# Patient Record
Sex: Male | Born: 1985 | Race: Black or African American | Hispanic: No | Marital: Single | Smoking: Never smoker
Health system: Southern US, Community
[De-identification: ages and names within clinical notes are randomized; demographics above are authoritative.]

---

## 2015-03-29 ENCOUNTER — Emergency Department (HOSPITAL_COMMUNITY)
Admission: EM | Admit: 2015-03-29 | Discharge: 2015-03-29 | Disposition: A | Discharge status: Home / Self Care | Payer: Managed Care, Other (non HMO) | Attending: Emergency Medicine | Admitting: Emergency Medicine

## 2015-03-29 ENCOUNTER — Encounter (HOSPITAL_COMMUNITY): Payer: Self-pay | Admitting: Emergency Medicine

## 2015-03-29 ENCOUNTER — Emergency Department (HOSPITAL_COMMUNITY): Payer: Managed Care, Other (non HMO)

## 2015-03-29 DIAGNOSIS — Y998 Other external cause status: Secondary | ICD-10-CM | POA: Insufficient documentation

## 2015-03-29 DIAGNOSIS — Y9289 Other specified places as the place of occurrence of the external cause: Secondary | ICD-10-CM | POA: Insufficient documentation

## 2015-03-29 DIAGNOSIS — S43401A Unspecified sprain of right shoulder joint, initial encounter: Secondary | ICD-10-CM | POA: Diagnosis not present

## 2015-03-29 DIAGNOSIS — W19XXXA Unspecified fall, initial encounter: Secondary | ICD-10-CM

## 2015-03-29 DIAGNOSIS — Y9302 Activity, running: Secondary | ICD-10-CM | POA: Diagnosis not present

## 2015-03-29 DIAGNOSIS — W01198A Fall on same level from slipping, tripping and stumbling with subsequent striking against other object, initial encounter: Secondary | ICD-10-CM | POA: Insufficient documentation

## 2015-03-29 DIAGNOSIS — S4991XA Unspecified injury of right shoulder and upper arm, initial encounter: Secondary | ICD-10-CM | POA: Diagnosis present

## 2015-03-29 MED ORDER — IBUPROFEN 800 MG PO TABS: 800.0000 mg | ORAL_TABLET | Freq: Three times a day (TID) | ORAL | Status: DC

## 2015-03-29 MED ORDER — IBUPROFEN 400 MG PO TABS
800.0000 mg | ORAL_TABLET | Freq: Once | ORAL | Status: AC
Start: 2015-03-29 — End: 2015-03-29
  Administered 2015-03-29: 800 mg via ORAL
  Filled 2015-03-29: qty 2

## 2015-03-29 NOTE — ED Notes (Signed)
Patient states "was playing around yesterday with friends and slipped and fell with his R arm out".  Patient complains of limited mobility and pain in R shoulder.    Patient denies other symptoms.

## 2015-03-29 NOTE — Discharge Instructions (Signed)
Rotator Cuff Injury °Rotator cuff injury is any type of injury to the set of muscles and tendons that make up the stabilizing unit of your shoulder. This unit holds the ball of your upper arm bone (humerus) in the socket of your shoulder blade (scapula).  °CAUSES °Injuries to your rotator cuff most commonly come from sports or activities that cause your arm to be moved repeatedly over your head. Examples of this include throwing, weight lifting, swimming, or racquet sports. Long lasting (chronic) irritation of your rotator cuff can cause soreness and swelling (inflammation), bursitis, and eventual damage to your tendons, such as a tear (rupture). °SIGNS AND SYMPTOMS °Acute rotator cuff tear: °· Sudden tearing sensation followed by severe pain shooting from your upper shoulder down your arm toward your elbow. °· Decreased range of motion of your shoulder because of pain and muscle spasm. °· Severe pain. °· Inability to raise your arm out to the side because of pain and loss of muscle power (large tears). °Chronic rotator cuff tear: °· Pain that usually is worse at night and may interfere with sleep. °· Gradual weakness and decreased shoulder motion as the pain worsens. °· Decreased range of motion. °Rotator cuff tendinitis:  °· Deep ache in your shoulder and the outside upper arm over your shoulder. °· Pain that comes on gradually and becomes worse when lifting your arm to the side or turning it inward. °DIAGNOSIS °Rotator cuff injury is diagnosed through a medical history, physical exam, and imaging exam. The medical history helps determine the type of rotator cuff injury. Your health care provider will look at your injured shoulder, feel the injured area, and ask you to move your shoulder in different positions. X-ray exams typically are done to rule out other causes of shoulder pain, such as fractures. MRI is the exam of choice for the most severe shoulder injuries because the images show muscles and tendons.    °TREATMENT  °Chronic tear: °· Medicine for pain, such as acetaminophen or ibuprofen. °· Physical therapy and range-of-motion exercises may be helpful in maintaining shoulder function and strength. °· Steroid injections into your shoulder joint. °· Surgical repair of the rotator cuff if the injury does not heal with noninvasive treatment. °Acute tear: °· Anti-inflammatory medicines such as ibuprofen and naproxen to help reduce pain and swelling. °· A sling to help support your arm and rest your rotator cuff muscles. Long-term use of a sling is not advised. It may cause significant stiffening of the shoulder joint. °· Surgery may be considered within a few weeks, especially in younger, active people, to return the shoulder to full function. °· Indications for surgical treatment include the following: °¨ Age younger than 60 years. °¨ Rotator cuff tears that are complete. °¨ Physical therapy, rest, and anti-inflammatory medicines have been used for 6-8 weeks, with no improvement. °¨ Employment or sporting activity that requires constant shoulder use. °Tendinitis: °· Anti-inflammatory medicines such as ibuprofen and naproxen to help reduce pain and swelling. °· A sling to help support your arm and rest your rotator cuff muscles. Long-term use of a sling is not advised. It may cause significant stiffening of the shoulder joint. °· Severe tendinitis may require: °¨ Steroid injections into your shoulder joint. °¨ Physical therapy. °¨ Surgery. °HOME CARE INSTRUCTIONS  °· Apply ice to your injury: °¨ Put ice in a plastic bag. °¨ Place a towel between your skin and the bag. °¨ Leave the ice on for 20 minutes, 2-3 times a day. °· If you   have a shoulder immobilizer (sling and straps), wear it until told otherwise by your health care provider. °· You may want to sleep on several pillows or in a recliner at night to lessen swelling and pain. °· Only take over-the-counter or prescription medicines for pain, discomfort, or fever as  directed by your health care provider. °· Do simple hand squeezing exercises with a soft rubber ball to decrease hand swelling. °SEEK MEDICAL CARE IF:  °· Your shoulder pain increases, or new pain or numbness develops in your arm, hand, or fingers. °· Your hand or fingers are colder than your other hand. °SEEK IMMEDIATE MEDICAL CARE IF:  °· Your arm, hand, or fingers are numb or tingling. °· Your arm, hand, or fingers are increasingly swollen and painful, or they turn white or blue. °MAKE SURE YOU: °· Understand these instructions. °· Will watch your condition. °· Will get help right away if you are not doing well or get worse. °Document Released: 08/18/2000 Document Revised: 08/26/2013 Document Reviewed: 04/02/2013 °ExitCare® Patient Information ©2015 [employer]. This information is not intended to replace advice given to you by your health care provider. Make sure you discuss any questions you have with your health care provider. ° ° °Emergency Department Resource Guide °1) Find a Doctor and Pay Out of Pocket °Although you won't have to find out who is covered by your insurance plan, it is a good idea to ask around and get recommendations. You will then need to call the office and see if the doctor you have chosen will accept you as a new patient and what types of options they offer for patients who are self-pay. Some doctors offer discounts or will set up payment plans for their patients who do not have insurance, but you will need to ask so you aren't surprised when you get to your appointment. ° °2) Contact Your Local Health Department °Not all health departments have doctors that can see patients for sick visits, but many do, so it is worth a call to see if yours does. If you don't know where your local health department is, you can check in your phone book. The CDC also has a tool to help you locate your state's health department, and many state websites also have listings of all of their local health  departments. ° °3) Find a Walk-in Clinic °If your illness is not likely to be very severe or complicated, you may want to try a walk in clinic. These are popping up all over the country in pharmacies, drugstores, and shopping centers. They're usually staffed by nurse practitioners or physician assistants that have been trained to treat common illnesses and complaints. They're usually fairly quick and inexpensive. However, if you have serious medical issues or chronic medical problems, these are probably not your best option. ° °No Primary Care Doctor: °- Call Health Connect at  [phone] - they can help you locate a primary care doctor that  accepts your insurance, provides certain services, etc. °- Physician Referral Service- [phone] ° °Chronic Pain Problems: °Organization         Address  Phone   Notes  °[facility]  [phone] Patients need to be referred by their primary care doctor.  ° °Medication Assistance: °Organization         Address  Phone   Notes  °[place] [facility] [address] °[place], [place] [address] [phone] --Must be a resident of [place] °--   Must have NO insurance coverage whatsoever (no Medicaid/ Medicare, etc.) -- The pt. MUST have a primary care doctor that directs their care regularly and follows them in the community   [facility]  [phone]   [employer]  [phone]    Agencies that provide inexpensive medical care: Organization         Address  Phone   Notes  [place]  [phone]   [facility]    [phone]   [facility] [place], [place] [address] [phone]   [place] [address] [place] [phone]   Planned Parenthood    [phone]   [facility]    [phone]   [address] and [place] [address], [place] Phone:  [phone], Fax:  [phone] Hours of Operation:  9 am - 6 pm, M-F.  Also accepts Medicaid/Medicare and self-pay.  [facility] for [place] [address], [address], [place] Phone: [phone], Fax: [phone]. Hours of Operation:  8:30 am - 5:30 pm, M-F.  Also accepts Medicaid and self-pay.  [facility] [place] [address] [place] Phone: [phone]   [place], [place], [place] [phone], Ext. [phone] Mondays & Thursdays: 7-9 AM.  First 15 patients are seen on a first come, first serve basis.    [place] Providers:  Organization         Address  Phone   Notes  [facility] [address], [place] [phone] Also accepts self-pay patients.  [facility] [id number] [place], [place]  [phone]   [place], [address], [place] [phone]   [facility] [address], [place] [phone]   Lucianne Lei [address], [place]   [phone] Only accepts [place] Access [place] patients after they have their name applied to their card.   Self-Pay (no insurance) in [facility]:  Organization         Address  Phone   Notes  Sickle Cell Patients, [facility] [place] [phone]   [facility] [place] [phone]   [facility] [place]  [place], [place], [place] [phone]   [facility]/Dr. Osei-Bonsu  [address], [place] or [address], [place] [phone] Phone number for both [place] and [place] locations is the same.  [facility] [address], [place] [phone]   [facility] [address] [place] or [address] [phone]   [facility] [address], [place] [phone], phone; [phone], fax Sees patients 1st and 3rd Saturday of every month.  Must not qualify for public or private insurance (i.e. Medicaid, Medicare, [place] Health Choice, Veterans' Benefits)  Household income should be no more than 200% of the poverty level The clinic cannot treat you if you are pregnant or think you are pregnant  Sexually transmitted diseases are not treated at the clinic.    Dental Care: Organization         Address  Phone  Notes  [facility] [address] [place] [phone] Accepts children up to age 27 who are enrolled in [place] or [place]; pregnant women with a Medicaid card; and children who have applied for Medicaid or [place] Health Choice, but were declined, whose parents can pay a reduced fee at time of service.  [facility]  [address], [place] [phone] Accepts children up to age 32 who are enrolled in [place] or [place]; pregnant women with a Medicaid card; and children who have applied for Medicaid or [place] Health Choice, but were declined, whose parents can pay a reduced fee at time of service.  [facility]  [place] [phone] Patients are seen by appointment only. Walk-ins are not accepted. [place] will see patients 82 years of age and older. Monday - Tuesday (8am-5pm) Most Wednesdays (8:30-5pm) $30 per visit, cash only  [facility] Adult [facility] Access PRO[facility]  [address], [facility] [phone] Patients are seen by appointment only. Walk-ins are not accepted. [place] will see patients 37 years of age and older. One Wednesday Evening (Monthly: Volunteer Based).  $30 per visit, cash only  [place]  [phone] for adults;  Children under age 47, call Graduate Pediatric Dentistry at [phone]. Children aged 6-14, please call [phone] to request a pediatric application.  Dental services are provided in all areas of dental care including fillings, crowns and bridges, complete and partial dentures, implants, gum treatment, root canals, and extractions. Preventive care is also provided. Treatment is provided to both adults and children. Patients are selected via a lottery and there is often a waiting list.   [facility] [address], [place]  [phone] [email or web address]   [facility] [address] [place], [place] [phone], Ext. [phone] Second and Fourth Thursday of each month, opens at 6:30 AM; Clinic ends at 9 AM.  Patients are seen on a first-come first-served basis, and a limited number are seen during each clinic.   [facility]  [address] Hillard Danker [place], [place] [phone]   Eligibility Requirements You must have lived in [place], [place], or [place] counties for at least the last three months.   You cannot be eligible for state or federal sponsored [employer], including [employer], [place], or [employer].   You generally cannot be eligible for healthcare insurance through your employer.    How to apply: Eligibility screenings are held every Tuesday and Wednesday afternoon from 1:00 pm until 4:00 pm. You do not need an appointment for the interview!  [facility] [address] [place], [place]   [place]  [place] Department  [place]  [phone]    Behavioral Health Resources in the Community: Intensive Outpatient Programs Organization         Address  Phone  Notes  [place]. [address], [place], [place] [phone]   [facility] [address], [place], [place]   ADS: [facility] [address] [place], [place]   [place] [address],  [place], [place] or [phone]   Substance Abuse Resources Organization  Address  Phone  Notes  Al[facility] Drug Services  [phone]   [place]  [phone]   [facility] [place]  [phone]   [place]inita Pester  [phone]   Residential & Outpatient Substance Abuse Program  [phone]   Psychological Services Organization         Address  Phone  Notes  [facility] [place]  [place]  [phone]   [place] [address], [place] or [phone]    Mobile Crisis Teams Organization         Address  Phone  Notes  [facility], Mobile Crisis Care Unit  [phone]   [facility]  [address] [place], [place]   [place] [address], [place] [phone]    Self-Help/Support Groups Organization         Address  Phone             Notes  [place]. of [place] - variety of support groups  [place] Call for more information  Narcotics Anonymous (NA), [facility] [address], [employer] [place]  2 meetings at this location   Special educational needs teacher         Address  Phone  Notes  [facility] [place],    [place]  [phone]   [facility] [place]  [address], [place] [id number], [place], [place]   [place], [place] [phone] Admissions: 8am-3pm M-F  Incentives Substance [place] [address]    [place], [place] [phone]   [facility] [address] [place], [place], [place]   [facility] [address]  [place], [place]   [facility] Programs - Intensive  Outpatient [address], [place], [place]   [facility] [place].,  [place], [place] [phone] or [phone]   [facility] ([facility]) [address], [place], [place] Accepts Medicaid  [facility] [place] [address],  [place] [phone] Substance Abuse/Addiction Treatment   [facility] Organization         Address  Phone  Notes  [facility]  [phone]   Domenic Schwab, PhD [address] Arlis [place], [place]   [phone] or [phone]   [place], [place] [phone]   [facility] [address], [place], [place] [phone] Insurance/Medicaid/sponsorship through [facility] and Families [address], [address] [place]                                    [place], [place] [phone] [place] [address], [place] [phone]    Dr. Adele Schilder  [phone]   [facility] Dept. 1) [address] [place] 2) [place] 3)  [place] [address], [place] [phone]  [phone]   [place] [phone] or [phone] (After Hours)

## 2015-03-29 NOTE — ED Provider Notes (Signed)
CSN: [id number]     Arrival date & time 03/29/15  0700 History   First MD Initiated Contact with Patient 03/29/15 [phone]     Chief Complaint  Patient presents with  . Fall     (Consider location/radiation/quality/duration/timing/severity/associated sxs/prior Treatment) HPI Patient is a 29 year old male who presents the ER complaining of right shoulder pain. Patient reports mechanical fall yesterday while running in the grass. He reports fall on outstretched right hand, and associated shoulder pain last night which has since progressively, gradually worsened. Patient reports painful full range of motion of his shoulder in any direction. Patient denies numbness, tingling, weakness, loss of sensation or function.  History reviewed. No pertinent past medical history. History reviewed. No pertinent past surgical history. No family history on file. History  Substance Use Topics  . Smoking status: Never Smoker   . Smokeless tobacco: Not on file  . Alcohol Use: Yes     Comment: socially    Review of Systems  Constitutional: Negative for fever.  Eyes: Negative for visual disturbance.  Respiratory: Negative for shortness of breath.   Cardiovascular: Negative for chest pain.  Gastrointestinal: Negative for nausea, vomiting and abdominal pain.  Genitourinary: Negative for dysuria.  Skin: Negative for rash.  Neurological: Negative for dizziness, syncope, weakness and numbness.  Psychiatric/Behavioral: Negative.       Allergies  Review of patient's allergies indicates no known allergies.  Home Medications   Prior to Admission medications   Medication Sig Start Date End Date Taking? Authorizing Provider  ibuprofen (ADVIL,MOTRIN) 800 MG tablet Take 1 tablet (800 mg total) by mouth 3 (three) times daily. 03/29/15   Ladona Mow, PA-C   BP 145/93 mmHg  Pulse 75  Temp(Src) 97.8 F (36.6 C) (Oral)  Resp 17  SpO2 98% Physical Exam  Constitutional: He appears well-developed and  well-nourished. No distress.  HENT:  Head: Normocephalic and atraumatic.  Eyes: Conjunctivae are normal. Right eye exhibits no discharge. Left eye exhibits no discharge. No scleral icterus.  Cardiovascular:  Peripheral pulses intact at injured extremity.   Pulmonary/Chest: Effort normal. No respiratory distress.  Musculoskeletal:  Right shoulder exam: Limited active and passive range of motion due to pain. Mild tenderness to palpation of upper deltoid region. No obvious deformity, erythema, swelling. Patient has 5 out of 5 motor strength at shoulder, elbow, wrist. Radial pulse 2+. Patient has reproduction of pain with empty can test, as well as forced external rotation. No pain with internal rotation. No frank instability or loss of function.  Neurological: He is alert.  No numbness distal to injury.    Skin: Skin is warm and dry. No rash noted. He is not diaphoretic.  Nursing note and vitals reviewed.   ED Course  Procedures (including critical care time) Labs Review Labs Reviewed - No data to display  Imaging Review Dg Shoulder Right  03/29/2015   CLINICAL DATA:  Fall on outstretched arm with right shoulder pain, initial encounter  EXAM: RIGHT SHOULDER - 2+ VIEW  COMPARISON:  None.  FINDINGS: There is no evidence of fracture or dislocation. There is no evidence of arthropathy or other focal bone abnormality. Soft tissues are unremarkable.  IMPRESSION: No acute abnormality noted.   Electronically Signed   By: Alcide Clever M.D.   On: 03/29/2015 07:35     EKG Interpretation None      MDM   Final diagnoses:  Fall  Shoulder sprain, right, initial encounter    Fall from standing position yesterday. Worsening right shoulder pain  this morning. Pain with external rotation and empty can test. No frank instability. Likely rotator cuff tear. X-rays unremarkable for acute pathology. Patient is neurovascularly intact. We'll place him in a sling, have him follow with orthopedics. Patient  afebrile, hematologic and stable and in acute distress. Encouraged RICE therapy, follow-up with ortho, return precautions discussed, patient verbalizes understanding and agreement of this plan.  BP 145/93 mmHg  Pulse 75  Temp(Src) 97.8 F (36.6 C) (Oral)  Resp 17  SpO2 98%  Signed,  Ladona Mow, PA-C 5:39 PM       Ladona Mow, PA-C 03/29/15 1740  Richardean Canal, MD 03/29/15 [phone]

## 2016-05-27 IMAGING — DX DG SHOULDER 2+V*R*
3 series · 3 of 3 positions shown · non-contrast
Comparison: None.

CLINICAL DATA: Fall on outstretched arm with right shoulder pain,
initial encounter

EXAM:
RIGHT SHOULDER - 2+ VIEW

[shoulder grashey]
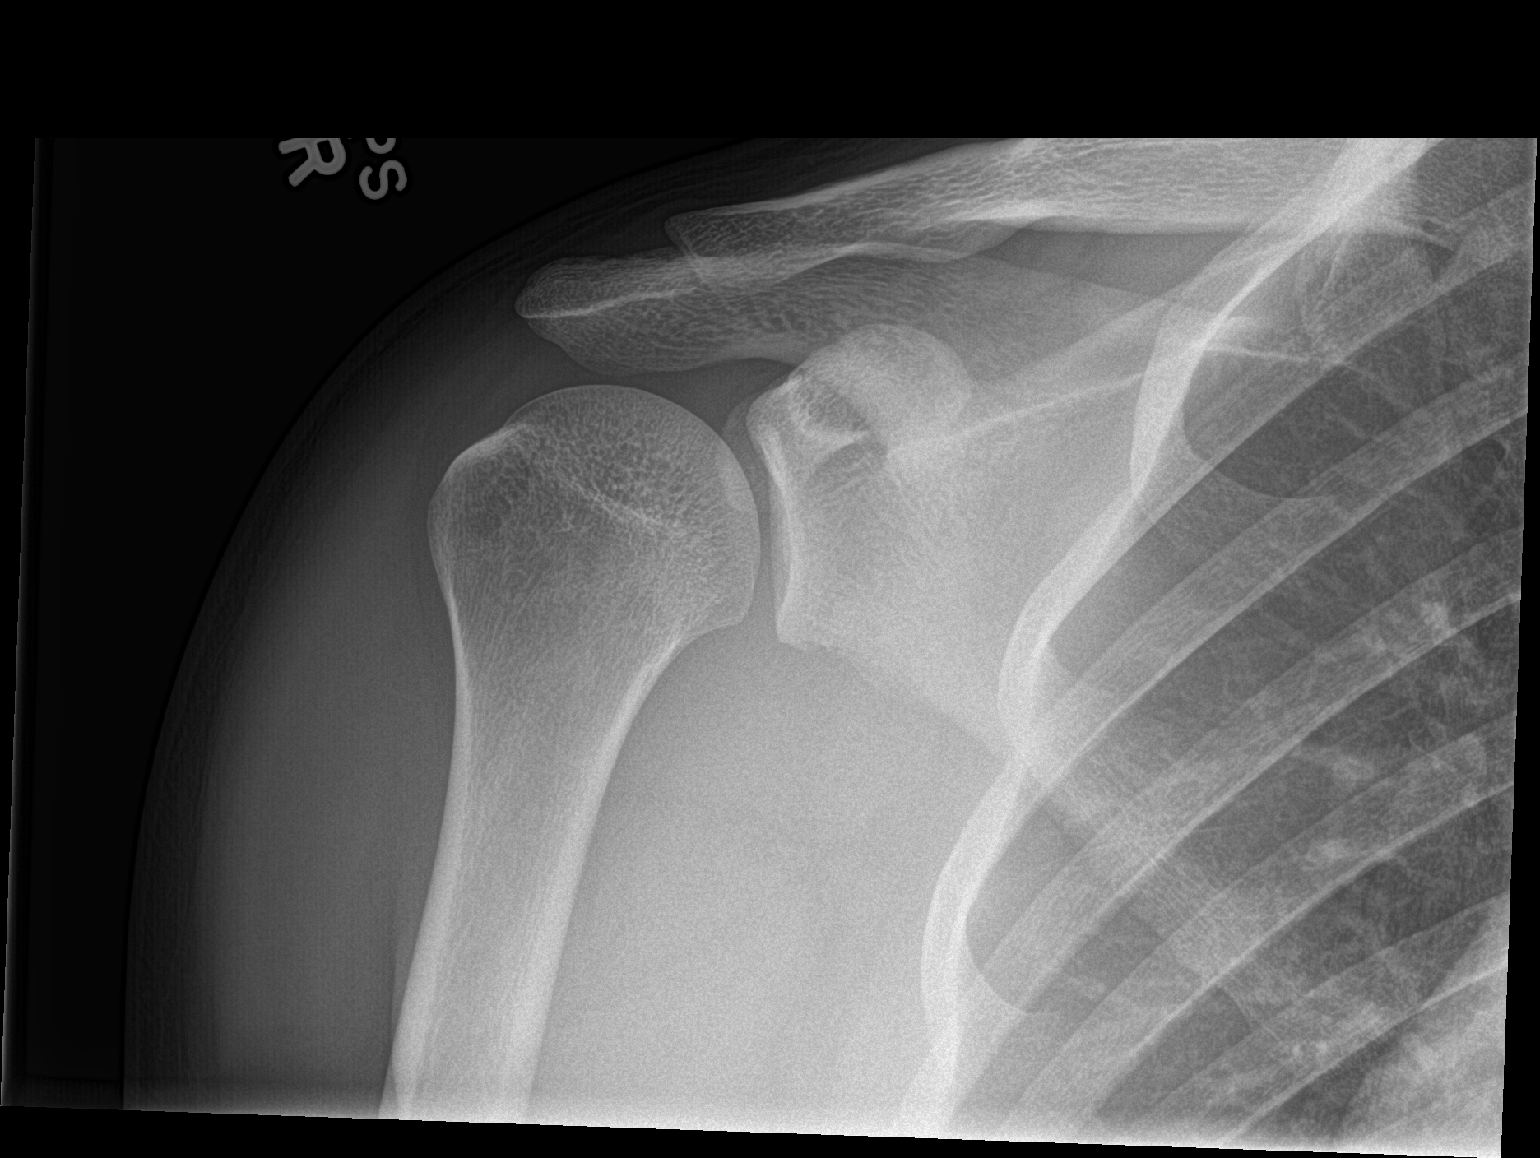

[shoulder y view]
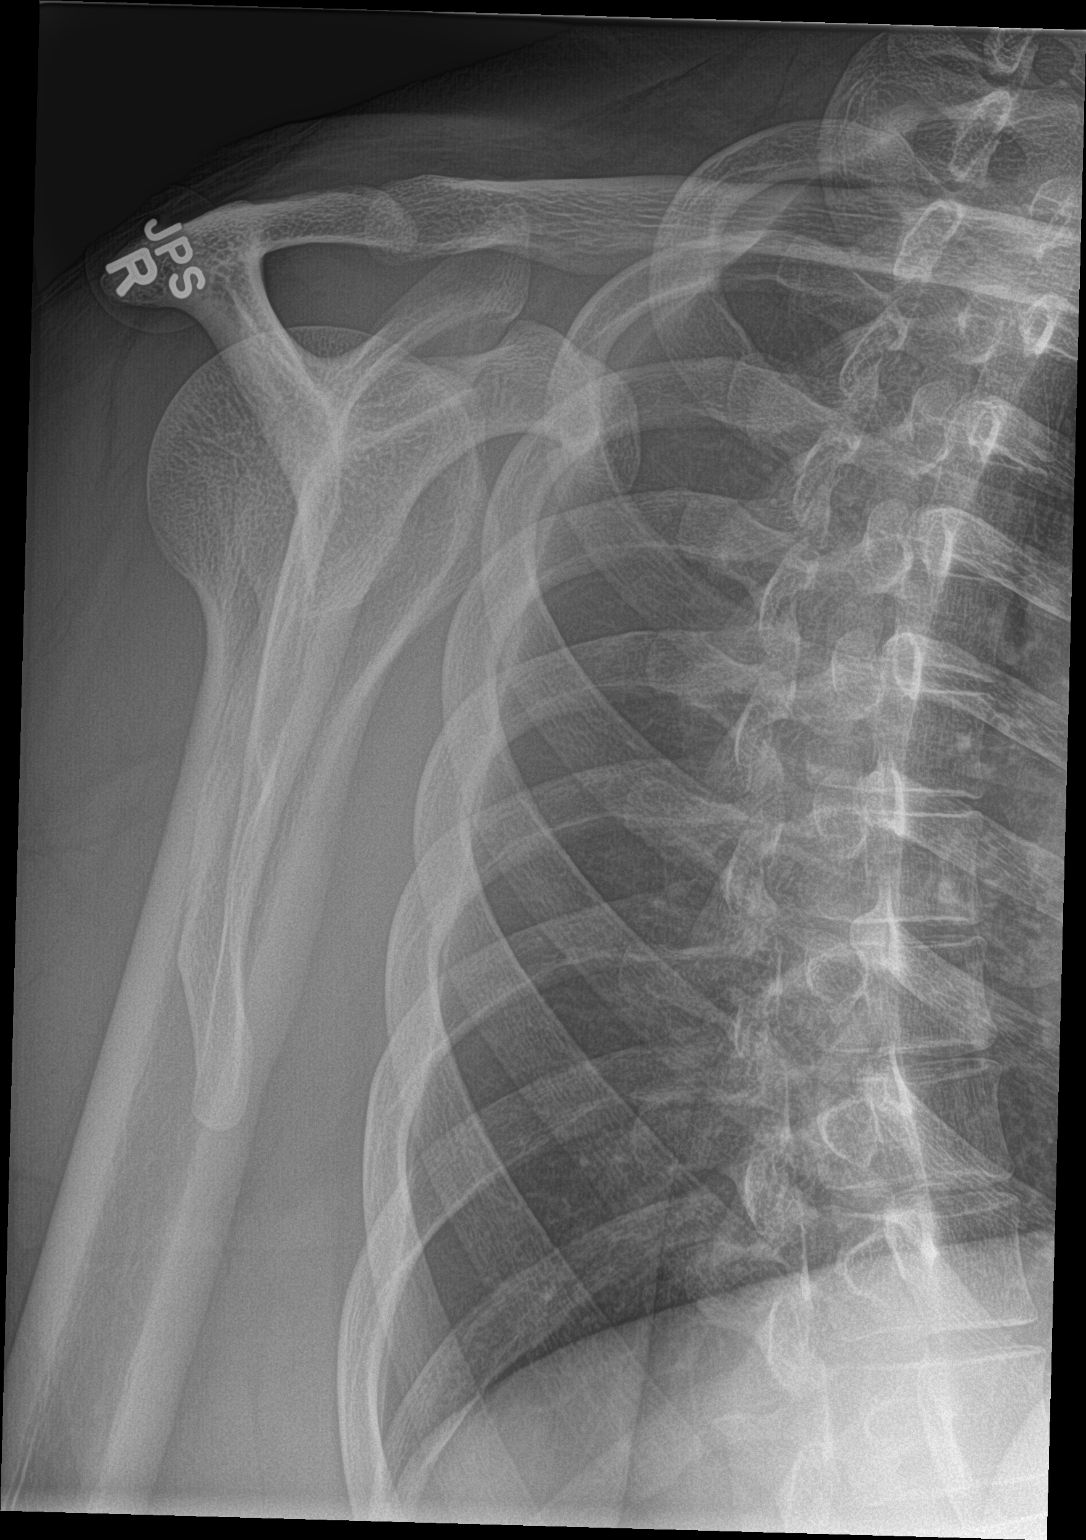

[shoulder axillary]
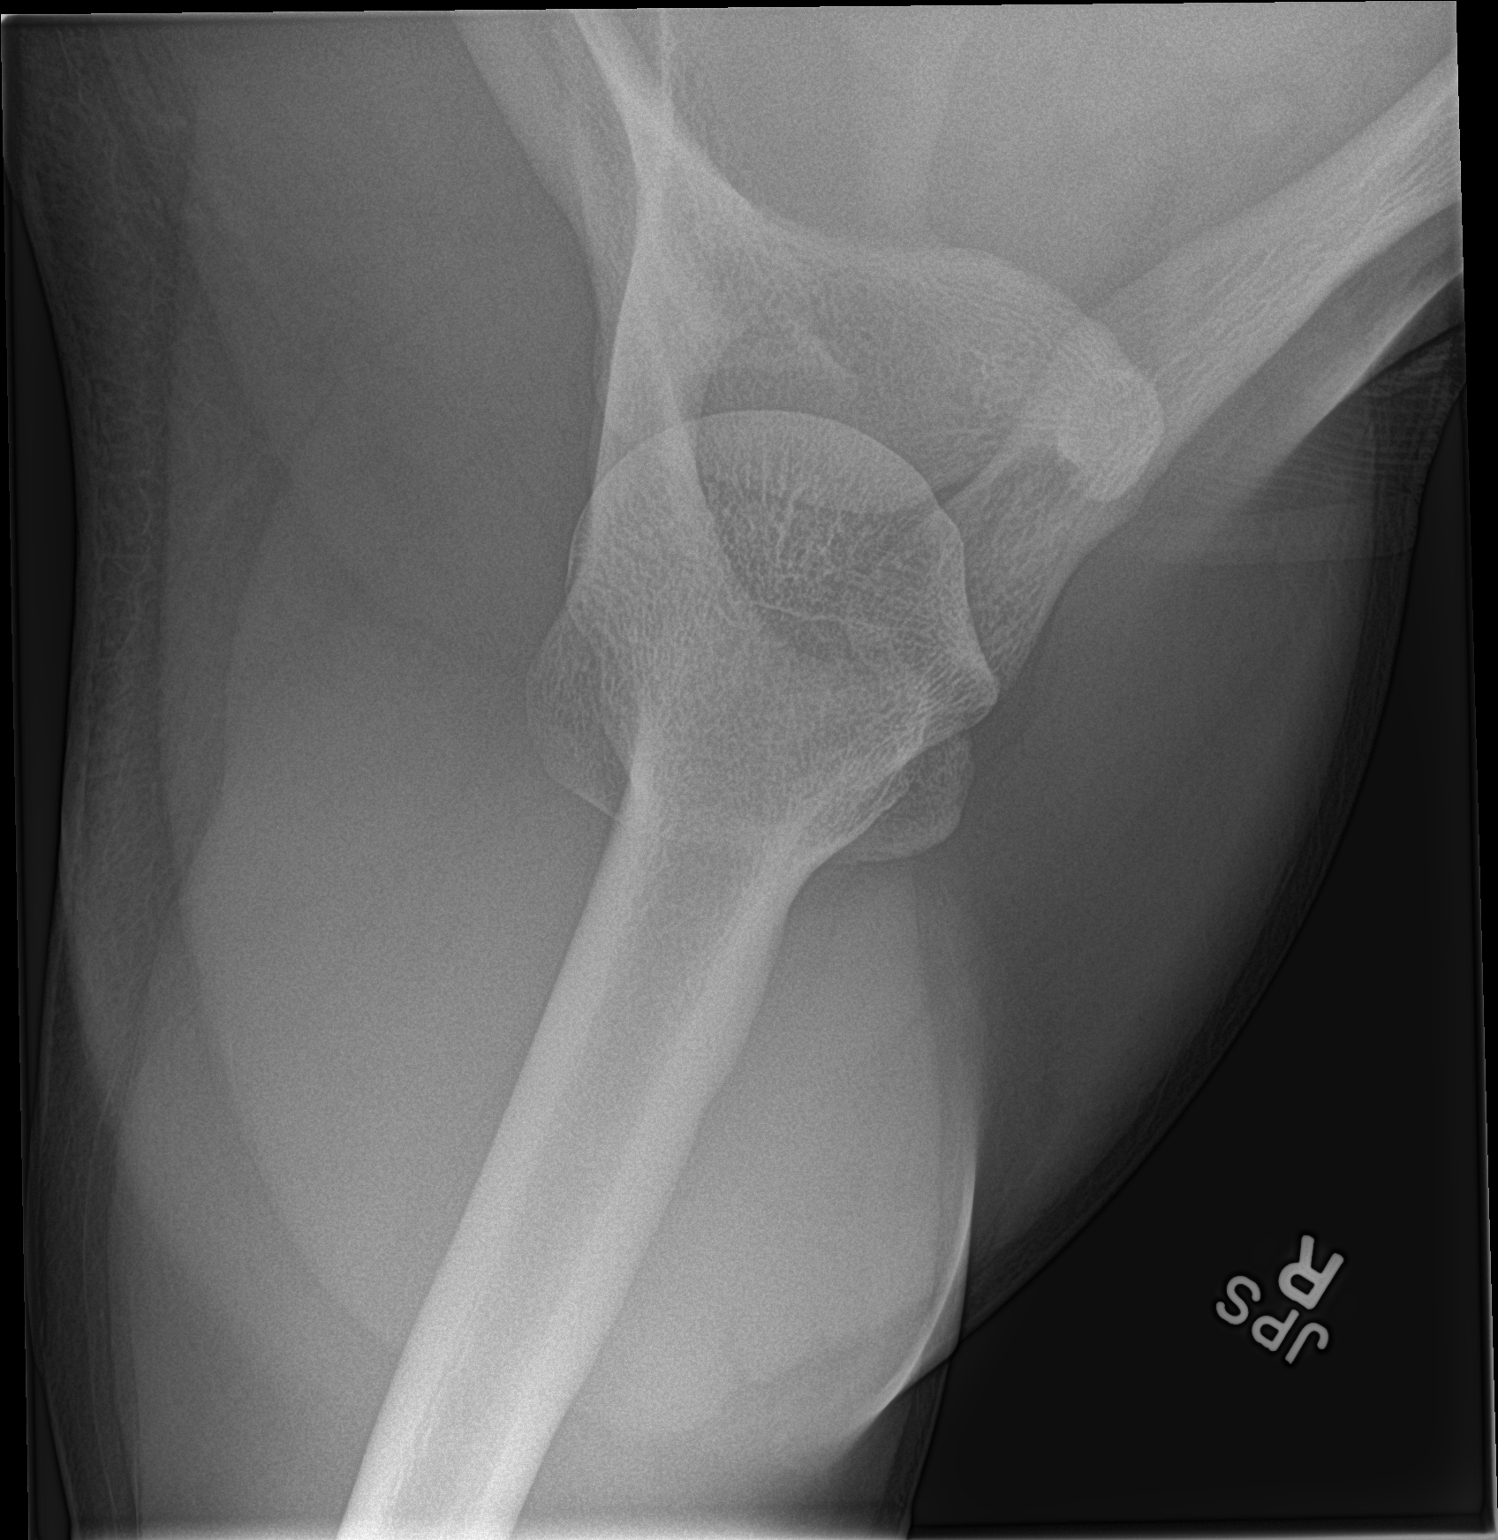

[3 of 3 positions shown; findings below may reference images not displayed]

FINDINGS: There is no evidence of fracture or dislocation. There is no
evidence of arthropathy or other focal bone abnormality. Soft
tissues are unremarkable.
IMPRESSION: No acute abnormality noted.

## 2019-09-01 ENCOUNTER — Ambulatory Visit: Payer: Managed Care, Other (non HMO) | Attending: Internal Medicine

## 2019-09-02 ENCOUNTER — Ambulatory Visit: Payer: Managed Care, Other (non HMO) | Attending: Internal Medicine

## 2019-09-02 DIAGNOSIS — Z20822 Contact with and (suspected) exposure to covid-19: Secondary | ICD-10-CM

## 2019-09-03 LAB — NOVEL CORONAVIRUS, NAA: SARS-CoV-2, NAA: NOT DETECTED

## 2020-08-16 ENCOUNTER — Emergency Department (HOSPITAL_COMMUNITY): Payer: Managed Care, Other (non HMO)

## 2020-08-16 ENCOUNTER — Encounter (HOSPITAL_COMMUNITY): Payer: Self-pay

## 2020-08-16 ENCOUNTER — Emergency Department (HOSPITAL_COMMUNITY)
Admission: EM | Admit: 2020-08-16 | Discharge: 2020-08-16 | Disposition: A | Discharge status: Home / Self Care | Payer: Managed Care, Other (non HMO) | Attending: Emergency Medicine | Admitting: Emergency Medicine

## 2020-08-16 DIAGNOSIS — R072 Precordial pain: Secondary | ICD-10-CM | POA: Insufficient documentation

## 2020-08-16 DIAGNOSIS — R079 Chest pain, unspecified: Secondary | ICD-10-CM | POA: Diagnosis present

## 2020-08-16 LAB — CBC WITH DIFFERENTIAL/PLATELET
Abs Immature Granulocytes: 0.05 10*3/uL (ref 0.00–0.07)
Basophils Absolute: 0.1 10*3/uL (ref 0.0–0.1)
Basophils Relative: 1 %
Eosinophils Absolute: 0.2 10*3/uL (ref 0.0–0.5)
Eosinophils Relative: 2 %
HCT: 39.4 % (ref 39.0–52.0)
Hemoglobin: 13.2 g/dL (ref 13.0–17.0)
Immature Granulocytes: 0 %
Lymphocytes Relative: 16 %
Lymphs Abs: 2.3 10*3/uL (ref 0.7–4.0)
MCH: 30.3 pg (ref 26.0–34.0)
MCHC: 33.5 g/dL (ref 30.0–36.0)
MCV: 90.6 fL (ref 80.0–100.0)
Monocytes Absolute: 1 10*3/uL (ref 0.1–1.0)
Monocytes Relative: 7 %
Neutro Abs: 10.6 10*3/uL — ABNORMAL HIGH (ref 1.7–7.7)
Neutrophils Relative %: 74 %
Platelets: 268 10*3/uL (ref 150–400)
RBC: 4.35 MIL/uL (ref 4.22–5.81)
RDW: 13.4 % (ref 11.5–15.5)
WBC: 14.3 10*3/uL — ABNORMAL HIGH (ref 4.0–10.5)
nRBC: 0 % (ref 0.0–0.2)

## 2020-08-16 LAB — BASIC METABOLIC PANEL
Anion gap: 10 (ref 5–15)
BUN: 11 mg/dL (ref 6–20)
CO2: 25 mmol/L (ref 22–32)
Calcium: 9.2 mg/dL (ref 8.9–10.3)
Chloride: 104 mmol/L (ref 98–111)
Creatinine, Ser: 1.15 mg/dL (ref 0.61–1.24)
GFR, Estimated: >60 mL/min (ref >60)
Glucose, Bld: 105 mg/dL — ABNORMAL HIGH (ref 70–99)
Potassium: 3.9 mmol/L (ref 3.5–5.1)
Sodium: 139 mmol/L (ref 135–145)

## 2020-08-16 LAB — TROPONIN I (HIGH SENSITIVITY)
Troponin I (High Sensitivity): 4 ng/L (ref <18)
Troponin I (High Sensitivity): 3 ng/L (ref <18)

## 2020-08-16 MED ORDER — KETOROLAC TROMETHAMINE 30 MG/ML IJ SOLN
30.0000 mg | Freq: Once | INTRAMUSCULAR | Status: AC
  Administered 2020-08-16: 30 mg via INTRAVENOUS
  Filled 2020-08-16: qty 1

## 2020-08-16 MED ORDER — IBUPROFEN 800 MG PO TABS: 800.0000 mg | ORAL_TABLET | Freq: Three times a day (TID) | ORAL | 0 refills | Status: AC | PRN

## 2020-08-16 MED ORDER — IOHEXOL 350 MG/ML SOLN
75.0000 mL | Freq: Once | INTRAVENOUS | Status: AC | PRN
Start: 2020-08-16 — End: 2020-08-16
  Administered 2020-08-16: 75 mL via INTRAVENOUS

## 2020-08-16 NOTE — ED Provider Notes (Signed)
Emergency Department Provider Note   I have reviewed the triage vital signs and the nursing notes.   HISTORY  Chief Complaint Chest Pain   HPI Jackson Hunter is a 34 y.o. male with PMH reviewed presents to the ED with acute onset CP which woke him from sleep.  Patient states that he is having a central to slightly left-sided sharp chest pain which is worse with deep breathing.  He does feel somewhat short of breath.  Symptoms have improved slightly since initial onset.  No fevers, chills, cough.  No vomiting or diarrhea.  Pain does not radiate into the abdomen or back.  No similar history like this in the past.  No prior history of DVT/PE.  He notes that he did have hand surgery several weeks ago without complication.   History reviewed. No pertinent past medical history.  There are no problems to display for this patient.   History reviewed. No pertinent surgical history.  Allergies Patient has no known allergies.  History reviewed. No pertinent family history.  Social History Social History   Tobacco Use  . Smoking status: Never Smoker  . Smokeless tobacco: Never Used  Vaping Use  . Vaping Use: Never used  Substance Use Topics  . Alcohol use: Yes    Comment: socially  . Drug use: Yes    Types: Marijuana    Review of Systems  Constitutional: No fever/chills Eyes: No visual changes. ENT: No sore throat. Cardiovascular: Positive chest pain. Respiratory: Positive shortness of breath. Gastrointestinal: No abdominal pain.  No nausea, no vomiting.  No diarrhea.  No constipation. Genitourinary: Negative for dysuria. Musculoskeletal: Negative for back pain. Skin: Negative for rash. Neurological: Negative for headaches, focal weakness or numbness.  10-point ROS otherwise negative.  ____________________________________________   PHYSICAL EXAM:  VITAL SIGNS: ED Triage Vitals  Enc Vitals Group     BP 08/16/20 0609 132/81     Pulse Rate 08/16/20 0609 77     Resp  08/16/20 0609 (!) 26     Temp 08/16/20 0609 97.8 F (36.6 C)     Temp Source 08/16/20 0609 Oral     SpO2 08/16/20 0605 99 %     Weight 08/16/20 0610 260 lb (117.9 kg)     Height 08/16/20 0610 6' (1.829 m)   Constitutional: Alert and oriented. Well appearing and in no acute distress. Eyes: Conjunctivae are normal.  Head: Atraumatic. Nose: No congestion/rhinnorhea. Mouth/Throat: Mucous membranes are moist.  Neck: No stridor.  Cardiovascular: Normal rate, regular rhythm. Good peripheral circulation. Grossly normal heart sounds.   Respiratory: Normal respiratory effort.  No retractions. Lungs CTAB. Gastrointestinal: Soft and nontender. No distention.  Musculoskeletal: No gross deformities of extremities. Neurologic:  Normal speech and language. Skin:  Skin is warm, dry and intact. No rash noted.   ____________________________________________   LABS (all labs ordered are listed, but only abnormal results are displayed)  Labs Reviewed  BASIC METABOLIC PANEL - Abnormal; Notable for the following components:      Result Value   Glucose, Bld 105 (*)    All other components within normal limits  CBC WITH DIFFERENTIAL/PLATELET - Abnormal; Notable for the following components:   WBC 14.3 (*)    Neutro Abs 10.6 (*)    All other components within normal limits  TROPONIN I (HIGH SENSITIVITY)  TROPONIN I (HIGH SENSITIVITY)   ____________________________________________  EKG   EKG Interpretation  Date/Time:  Monday August 16 2020 06:12:28 EST Ventricular Rate:  78 PR Interval:  QRS Duration: 79 QT Interval:  367 QTC Calculation: 418 R Axis:   68 Text Interpretation: Sinus rhythm ST elevation suggests acute pericarditis Confirmed by Zadie Rhine ([id number]) on 08/16/2020 6:14:30 AM       ____________________________________________  RADIOLOGY  CT Angio Chest PE W and/or Wo Contrast  Result Date: 08/16/2020 CLINICAL DATA:  34 year old male with chest pain tachycardia.  EXAM: CT ANGIOGRAPHY CHEST WITH CONTRAST TECHNIQUE: Multidetector CT imaging of the chest was performed using the standard protocol during bolus administration of intravenous contrast. Multiplanar CT image reconstructions and MIPs were obtained to evaluate the vascular anatomy. CONTRAST:  Seventy-five mL Omnipaque 350, intravenous COMPARISON:  Chest radiograph from earlier the same day FINDINGS: Cardiovascular: Satisfactory opacification of the pulmonary arteries to the segmental level. No evidence of pulmonary embolism. The main pulmonary artery is normal in size. The RV to LV ratio is normal. Normal heart size. No pericardial effusion. Mediastinum/Nodes: No enlarged mediastinal, hilar, or axillary lymph nodes. Thyroid gland, trachea, and esophagus demonstrate no significant findings. Lungs/Pleura: No focal consolidations. No suspicious pulmonary nodules. No pleural effusion or pneumothorax. Upper Abdomen: The visualized upper abdomen is within normal limits. Musculoskeletal: No chest wall abnormality. No acute or significant osseous findings. Review of the MIP images confirms the above findings. IMPRESSION: Vascular: No evidence of pulmonary embolism. Non-Vascular: No acute thoracic abnormality. Marliss Coots, MD [facility] Radiology Electronically Signed   By: Marliss Coots MD   On: 08/16/2020 08:06   DG Chest Port 1 View  Result Date: 08/16/2020 CLINICAL DATA:  Chest pain EXAM: PORTABLE CHEST 1 VIEW COMPARISON:  None. FINDINGS: 0624 hours. The lungs are clear without focal pneumonia, edema, pneumothorax or pleural effusion. Streaky opacity at the left base suggests atelectasis. The cardiopericardial silhouette is within normal limits for size. The visualized bony structures of the thorax show no acute abnormality. Telemetry leads overlie the chest. IMPRESSION: Streaky opacity at the left base suggests atelectasis. Infection considered less likely but not  excluded. Otherwise no acute cardiopulmonary findings. Electronically Signed   By: Kennith Center M.D.   On: 08/16/2020 06:33    ____________________________________________   PROCEDURES  Procedure(s) performed:   Procedures  None  ____________________________________________   INITIAL IMPRESSION / ASSESSMENT AND PLAN / ED COURSE  Pertinent labs & imaging results that were available during my care of the patient were reviewed by me and considered in my medical decision making (see chart for details).   Patient presents to the emergency room for evaluation of acute onset chest pain.  Patient describing some slightly pleuritic discomfort. Normal vitals. Patient has had recent surgery. Moderate risk for PE. Given description of pain my suspicion for PE is elevated. I do not think the patient would be adequately risk stratified with d-dimer. Will send for CTA.   09:25 AM   CTA of the chest shows no pulmonary embolism.  No other cause for the patient's pain identified on scan.  Specifically, no infiltrate or concern for possible pneumonia.  Patient's finding on chest x-ray likely atelectasis.  Has a mild leukocytosis on labs but no other infectious signs or symptoms.  Repeat troponin is unremarkable.  Plan for treatment of pain with anti-inflammatories for possible pericarditis given EKG findings versus other MSK etiology.  Lower suspicion for esophagitis/gastritis although is a possibility.  We will have the patient follow closely with primary care doctor and provided contact information for one at discharge.  Also provided work note along with ED return precautions.  ____________________________________________  FINAL CLINICAL IMPRESSION(S) / ED DIAGNOSES  Final diagnoses:  Precordial chest pain     MEDICATIONS GIVEN DURING THIS VISIT:  Medications  ketorolac (TORADOL) 30 MG/ML injection 30 mg (30 mg Intravenous Given 08/16/20 0716)  iohexol (OMNIPAQUE) 350 MG/ML injection 75 mL (75  mLs Intravenous Contrast Given 08/16/20 0740)     NEW OUTPATIENT MEDICATIONS STARTED DURING THIS VISIT:  New Prescriptions   IBUPROFEN (ADVIL) 800 MG TABLET    Take 1 tablet (800 mg total) by mouth every 8 (eight) hours as needed for moderate pain.    Note:  This document was prepared using Dragon voice recognition software and may include unintentional dictation errors.  Alona Bene, MD, [facility] Emergency Medicine    Jeannemarie Sawaya, Arlyss Repress, MD 08/16/20 [phone]

## 2020-08-16 NOTE — Discharge Instructions (Signed)
You were seen in the emergency department today with chest pain.  Your labs for heart attack and blood clots in the lungs are normal.  I am calling in some indication for pain to your pharmacy.  I would expect her symptoms to improve over the next several days.  If you develop any new or suddenly worsening symptoms please return to the emergency department immediately for evaluation.  I have listed the name of a primary care doctor if you do not already have one.  Please call to schedule the next available appointment.

## 2020-08-16 NOTE — ED Triage Notes (Signed)
Pt coming with EMS from home with severe CP that began when he woke up this morning. Pt tachypneic with radiation to LT side of neck. Pt with hx of anxiety.

## 2020-08-16 NOTE — ED Notes (Signed)
First contact. Change of shift. Pt resting in bed. Pain controlled.
